# Patient Record
Sex: Female | Born: 1940 | Race: White | Hispanic: No | Smoking: Never smoker
Health system: Southern US, Community
[De-identification: ages and names within clinical notes are randomized; demographics above are authoritative.]

## PROBLEM LIST (undated history)

## (undated) DIAGNOSIS — L719 Rosacea, unspecified: Secondary | ICD-10-CM

## (undated) DIAGNOSIS — G8929 Other chronic pain: Secondary | ICD-10-CM

## (undated) DIAGNOSIS — M25569 Pain in unspecified knee: Secondary | ICD-10-CM

## (undated) DIAGNOSIS — N390 Urinary tract infection, site not specified: Secondary | ICD-10-CM

## (undated) DIAGNOSIS — D869 Sarcoidosis, unspecified: Secondary | ICD-10-CM

---

## 2013-07-01 ENCOUNTER — Emergency Department (HOSPITAL_BASED_OUTPATIENT_CLINIC_OR_DEPARTMENT_OTHER): Payer: Medicare Other

## 2013-07-01 ENCOUNTER — Encounter (HOSPITAL_BASED_OUTPATIENT_CLINIC_OR_DEPARTMENT_OTHER): Payer: Self-pay | Admitting: Emergency Medicine

## 2013-07-01 ENCOUNTER — Emergency Department (HOSPITAL_BASED_OUTPATIENT_CLINIC_OR_DEPARTMENT_OTHER)
Admission: EM | Admit: 2013-07-01 | Discharge: 2013-07-01 | Disposition: A | Discharge status: Home / Self Care | Payer: Medicare Other | Attending: Emergency Medicine | Admitting: Emergency Medicine

## 2013-07-01 DIAGNOSIS — IMO0002 Reserved for concepts with insufficient information to code with codable children: Secondary | ICD-10-CM | POA: Insufficient documentation

## 2013-07-01 DIAGNOSIS — Z87440 Personal history of urinary (tract) infections: Secondary | ICD-10-CM | POA: Insufficient documentation

## 2013-07-01 DIAGNOSIS — Y9289 Other specified places as the place of occurrence of the external cause: Secondary | ICD-10-CM | POA: Insufficient documentation

## 2013-07-01 DIAGNOSIS — S42209A Unspecified fracture of upper end of unspecified humerus, initial encounter for closed fracture: Secondary | ICD-10-CM | POA: Insufficient documentation

## 2013-07-01 DIAGNOSIS — Z872 Personal history of diseases of the skin and subcutaneous tissue: Secondary | ICD-10-CM | POA: Insufficient documentation

## 2013-07-01 DIAGNOSIS — R296 Repeated falls: Secondary | ICD-10-CM | POA: Insufficient documentation

## 2013-07-01 DIAGNOSIS — S2239XA Fracture of one rib, unspecified side, initial encounter for closed fracture: Secondary | ICD-10-CM | POA: Insufficient documentation

## 2013-07-01 DIAGNOSIS — G8929 Other chronic pain: Secondary | ICD-10-CM | POA: Insufficient documentation

## 2013-07-01 DIAGNOSIS — Y939 Activity, unspecified: Secondary | ICD-10-CM | POA: Insufficient documentation

## 2013-07-01 DIAGNOSIS — Z8619 Personal history of other infectious and parasitic diseases: Secondary | ICD-10-CM | POA: Insufficient documentation

## 2013-07-01 HISTORY — DX: Sarcoidosis, unspecified: D86.9

## 2013-07-01 HISTORY — DX: Urinary tract infection, site not specified: N39.0

## 2013-07-01 HISTORY — DX: Other chronic pain: G89.29

## 2013-07-01 HISTORY — DX: Pain in unspecified knee: M25.569

## 2013-07-01 HISTORY — DX: Rosacea, unspecified: L71.9

## 2013-07-01 MED ORDER — CYCLOBENZAPRINE HCL 5 MG PO TABS: 5.0000 mg | ORAL_TABLET | Freq: Two times a day (BID) | ORAL | Status: AC | PRN

## 2013-07-01 MED ORDER — HYDROCODONE-ACETAMINOPHEN 5-325 MG PO TABS: 1.0000 | ORAL_TABLET | ORAL | Status: AC | PRN

## 2013-07-01 NOTE — ED Notes (Signed)
Left shoulder and back pain.  States she was leaning over into a fountain, lost balance and fell into the water.

## 2013-07-01 NOTE — ED Provider Notes (Signed)
Medical screening examination/treatment/procedure(s) were conducted as a shared visit with non-physician practitioner(s) and myself.  I personally evaluated the patient during the encounter.   EKG Interpretation None      74F here s/p fall. Sustained L humeral head fx and a rib fx. NVI distally, decreased ROM of shoulder. Placed in sling, given Ortho f/u.  Dagmar HaitWilliam Edd Reppert, MD 07/01/13 [phone]

## 2013-07-01 NOTE — Discharge Instructions (Signed)
Humerus Fracture, Treated with Immobilization The humerus is the large bone in the upper arm. A broken (fractured) humerus is often treated by wearing a cast, splint, or sling (immobilization). This holds the broken pieces in place so they can heal.  HOME CARE  Put ice on the injured area.  Put ice in a plastic bag.  Place a towel between your skin and the bag.  Leave the ice on for 15-20 minutes, 03-04 times a day.  If you are given a cast:  Do not scratch the skin under the cast.  Check the skin around the cast every day. You may put lotion on any red or sore areas.  Keep the cast dry and clean.  If you are given a splint:  Wear the splint as told.  Keep the splint clean and dry.  Loosen the elastic around the splint if your fingers become numb, cold, tingle, or turn blue.  If you are given a sling:  Wear the sling as told.  Do not put pressure on any part of the cast or splint until it is fully hardened.  The cast or splint must be protected with a plastic bag during bathing. Do not lower the cast or splint into water.  Only take medicine as told by your doctor.  Do exercises as told by your doctor.  Follow up as told by your doctor. GET HELP RIGHT AWAY IF:   Your skin or fingernails turn blue or gray.  Your arm feels cold or numb.  You have very bad pain in the injured arm.  You are having problems with the medicines you were given. MAKE SURE YOU:   Understand these instructions.  Will watch your condition.  Will get help right away if you are not doing well or get worse. Document Released: 09/11/2007 Document Revised: 06/17/2011 Document Reviewed: 05/09/2010 [facility] Patient Information 2014 [place], [place].  Rib Fracture A rib fracture is a break or crack in one of the bones of the ribs. The ribs are like a cage that goes around your upper chest. A broken or cracked rib is often painful, but most do not cause other problems. Most rib fractures heal  on their own in 1 3 months. HOME CARE  Avoid activities that cause pain to the injured area. Protect your injured area.  Slowly increase activity as told by your doctor.  Take medicine as told by your doctor.  Put ice on the injured area for the first 1 2 days after you have been treated or as told by your doctor.  Put ice in a plastic bag.  Place a towel between your skin and the bag.  Leave the ice on for 15 20 minutes at a time, every 2 hours while you are awake.  Do deep breathing as told by your doctor. You may be told to:  Take deep breaths many times a day.  Cough many times a day while hugging a pillow.  Use a device (incentive spirometer) to perform deep breathing many times a day.  Drink enough fluids to keep your pee (urine) clear or pale yellow.   Do not wear a rib belt or binder. These do not allow you to breathe deeply. GET HELP RIGHT AWAY IF:   You have a fever.  You have trouble breathing.   You cannot stop coughing.  You cough up thick or bloody spit (mucus).   You feel sick to your stomach (nauseous), throw up (vomit), or have belly (abdominal) pain.  Your pain gets worse and medicine does not help.  MAKE SURE YOU:   Understand these instructions.  Will watch your condition.  Will get help right away if you are not doing well or get worse. Document Released: 01/02/2008 Document Revised: 07/20/2012 Document Reviewed: 05/27/2012 [facility] Patient Information 2014 [place], [place].

## 2013-07-01 NOTE — ED Notes (Signed)
Patient getting dressed.

## 2013-07-01 NOTE — ED Provider Notes (Signed)
CSN: [id number]     Arrival date & time 07/01/13  1152 History   First MD Initiated Contact with Patient 07/01/13 1204     Chief Complaint  Patient presents with  . Fall  . Shoulder Pain  . Back Pain     (Consider location/radiation/quality/duration/timing/severity/associated sxs/prior Treatment) HPI Comments: Pt states that she was leaning over the fountain and lost her balance and fell in the water. Pt states that it was no loc or dizziness.pt is complaining of pain in the left shoulder. Pt states that she cant raise her arm. Denies numbness or weakness. Pt states that she didn't hit her head.  The history is provided by the patient. No language interpreter was used.    Past Medical History  Diagnosis Date  . UTI (urinary tract infection)   . Chronic knee pain   . Rosacea   . Sarcoidosis    History reviewed. No pertinent past surgical history. No family history on file. History  Substance Use Topics  . Smoking status: Never Smoker   . Smokeless tobacco: Not on file  . Alcohol Use: Yes     Comment: occasional   OB History   Grav Para Term Preterm Abortions TAB SAB Ect Mult Living                 Review of Systems  Constitutional: Negative.   Respiratory: Negative.   Cardiovascular: Negative.       Allergies  Review of patient's allergies indicates no known allergies.  Home Medications   Current Outpatient Rx  Name  Route  Sig  Dispense  Refill  . Ciprofloxacin (CIPRO PO)   Oral   Take by mouth.         . Ibuprofen 200 MG CAPS   Oral   Take by mouth as needed.          BP 161/92  Pulse 88  Temp(Src) 98 F (36.7 C) (Oral)  Resp 18  Ht 5\' 3"  (1.6 m)  Wt 275 lb (124.739 kg)  BMI 48.73 kg/m2  SpO2 96% Physical Exam  Constitutional: She is oriented to person, place, and time. She appears well-developed and well-nourished.  HENT:  Head: Normocephalic and atraumatic.  Eyes: Conjunctivae and EOM are normal. Pupils are equal, round, and reactive  to light.  Neck: Normal range of motion. Neck supple.  Pulmonary/Chest: Effort normal and breath sounds normal.  Abdominal: Soft. Bowel sounds are normal.  Musculoskeletal:       Cervical back: Normal.       Thoracic back: Normal.       Lumbar back: Normal.  Abrasion the left posterior ribs. Tender in the lateral aspect of the left shoulder without gross deformity  Neurological: She is alert and oriented to person, place, and time. Coordination normal.  Skin: Skin is warm and dry.  Psychiatric: She has a normal mood and affect.    ED Course  Procedures (including critical care time) Labs Review Labs Reviewed - No data to display Imaging Review Dg Ribs Unilateral W/chest Left  07/01/2013   CLINICAL DATA:  Recent traumatic injury with left-sided chest pain  EXAM: LEFT RIBS AND CHEST - 3+ VIEW  COMPARISON:  None.  FINDINGS: Mild irregularity of the left sixth rib is noted laterally consistent with a mildly displaced fracture. No underlying pneumothorax is seen. The comminuted fracture of the proximal left humerus is again identified.  IMPRESSION: Left sixth rib fracture.  Left proximal humeral fracture   Electronically Signed  By: Alcide CleverMark  Lukens M.D.   On: 07/01/2013 12:42   Dg Shoulder Left  07/01/2013   CLINICAL DATA:  Left shoulder pain after fall.  EXAM: LEFT SHOULDER - 2+ VIEW  COMPARISON:  None.  FINDINGS: Severely displaced and probably comminuted fracture is seen involving the left proximal humerus. The humeral head appears to be posterior to the glenoid labrum. Visualized ribs appear normal.  IMPRESSION: Severely displaced and probably comminuted fracture involving the left proximal humerus.   Electronically Signed   By: Roque LiasJames  Green M.D.   On: 07/01/2013 12:39     EKG Interpretation None      MDM   Final diagnoses:  Rib fracture  Proximal humerus fracture    Pt is neurologically intact. Pt placed in a sling.pt is not from the area so will see ortho when she gets home. No  sign of injury to head and pt is not on thinners    Teressa LowerVrinda Hanni [place], NP 07/01/13 1318

## 2015-03-01 IMAGING — CR DG SHOULDER 2+V*L*
2 series · 2 of 2 positions shown · non-contrast
Comparison: None.

CLINICAL DATA: Left shoulder pain after fall.

EXAM:
LEFT SHOULDER - 2+ VIEW

[w shoulder ap internal left]
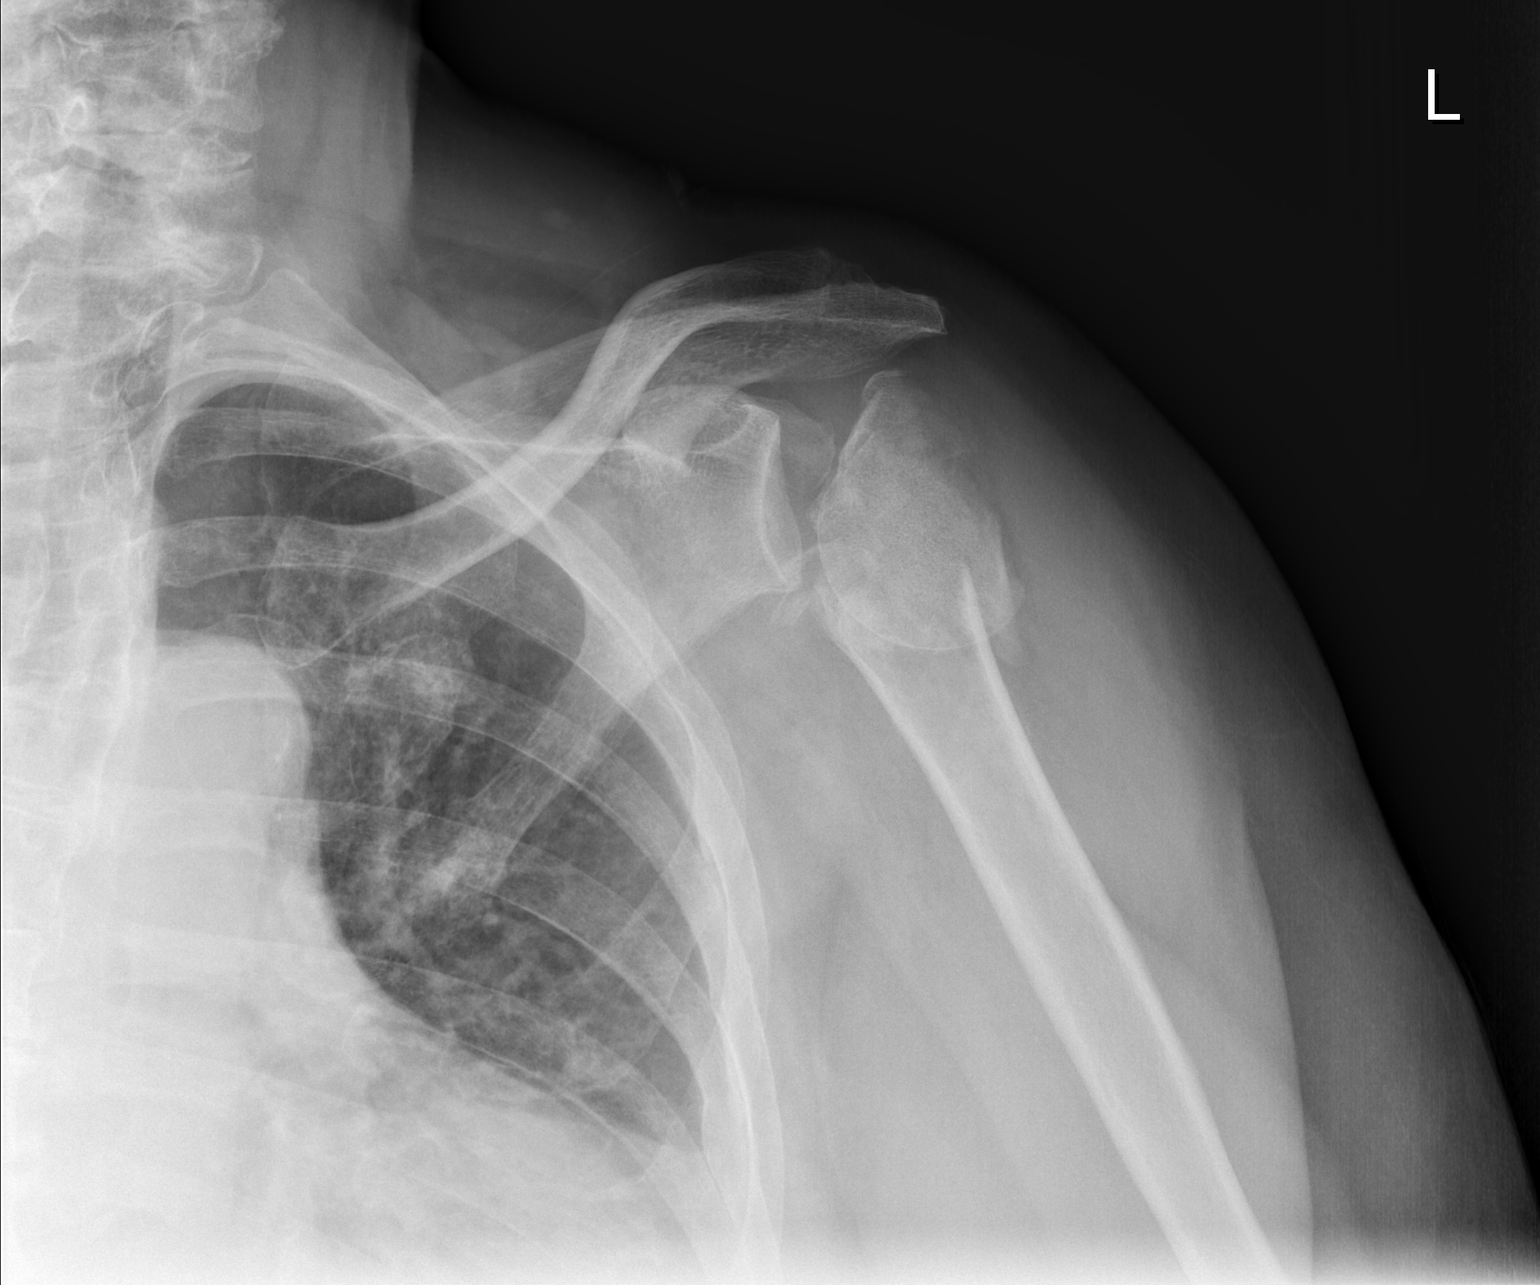

[w shoulder y view left]
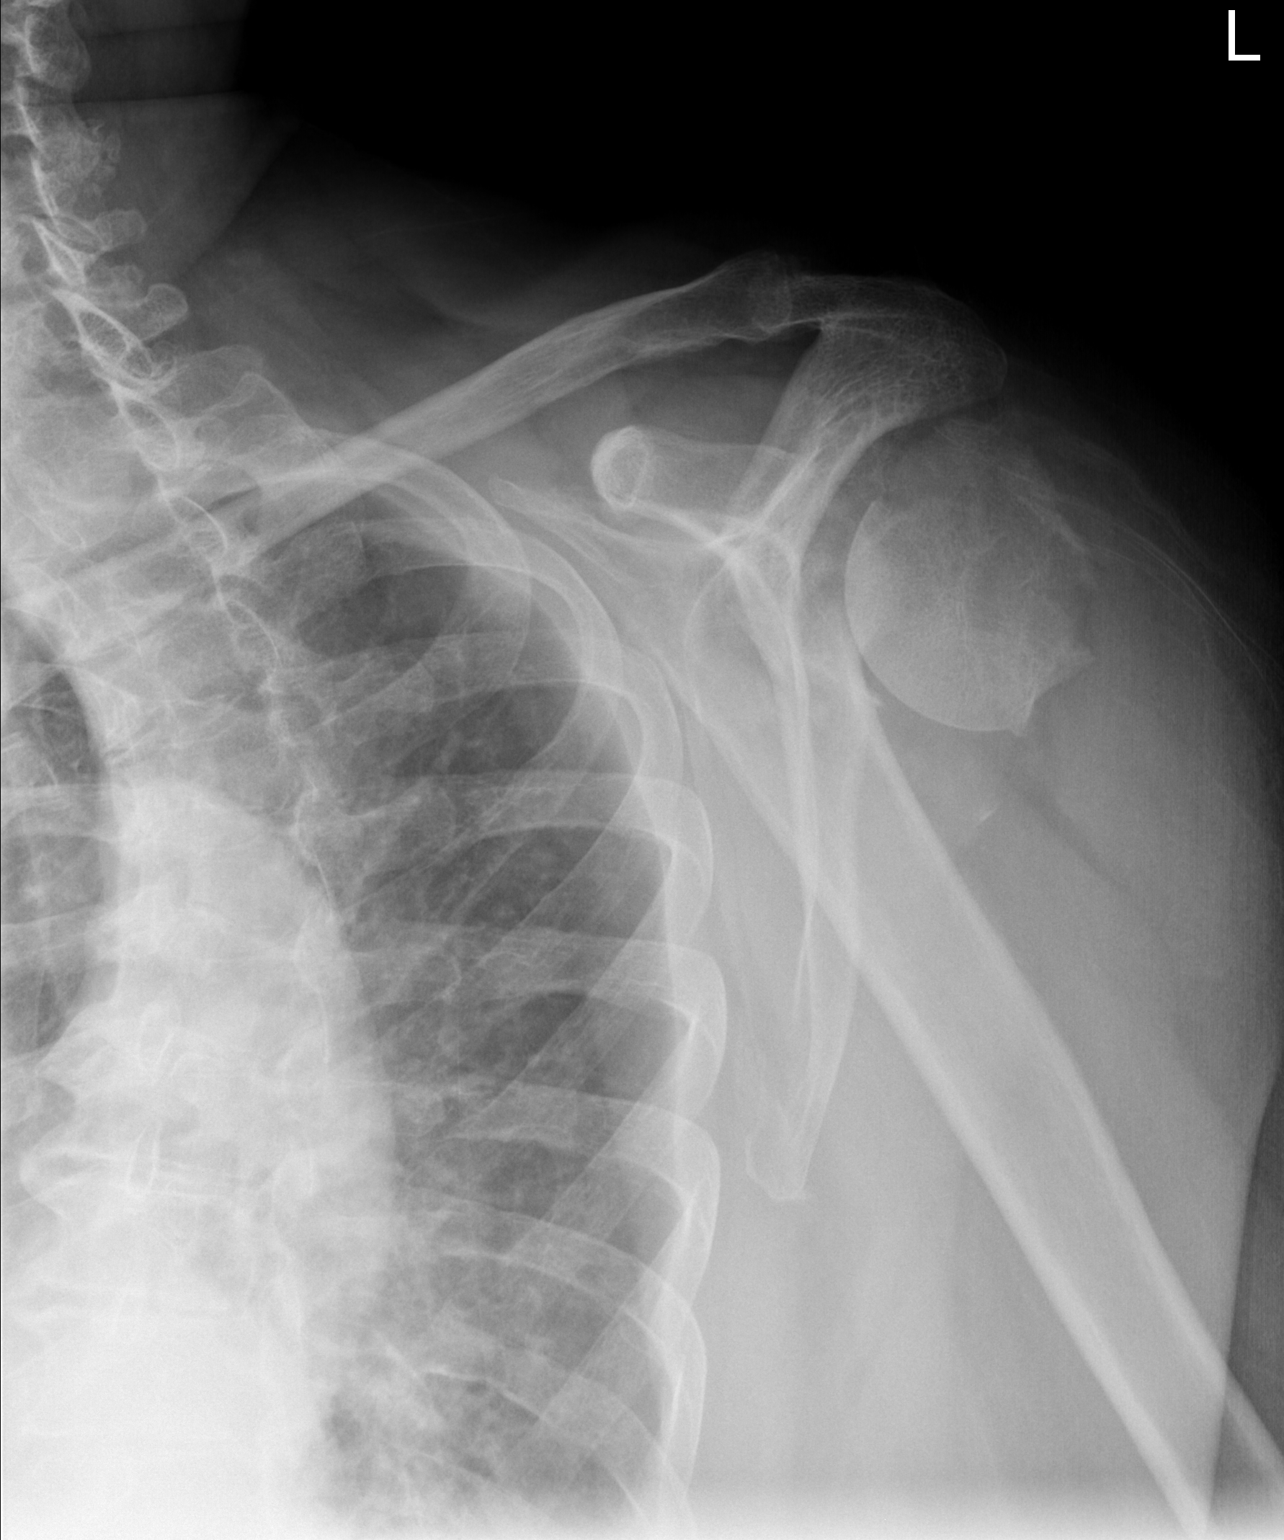

[2 of 2 positions shown; findings below may reference images not displayed]

FINDINGS: Severely displaced and probably comminuted fracture is seen
involving the left proximal humerus. The humeral head appears to be
posterior to the glenoid labrum. Visualized ribs appear normal.
IMPRESSION: Severely displaced and probably comminuted fracture involving the
left proximal humerus.

## 2015-03-01 IMAGING — CR DG RIBS W/ CHEST 3+V*L*
3 series · 3 of 3 positions shown · non-contrast
Comparison: None.

CLINICAL DATA: Recent traumatic injury with left-sided chest pain

EXAM:
LEFT RIBS AND CHEST - 3+ VIEW

[w chest pa]
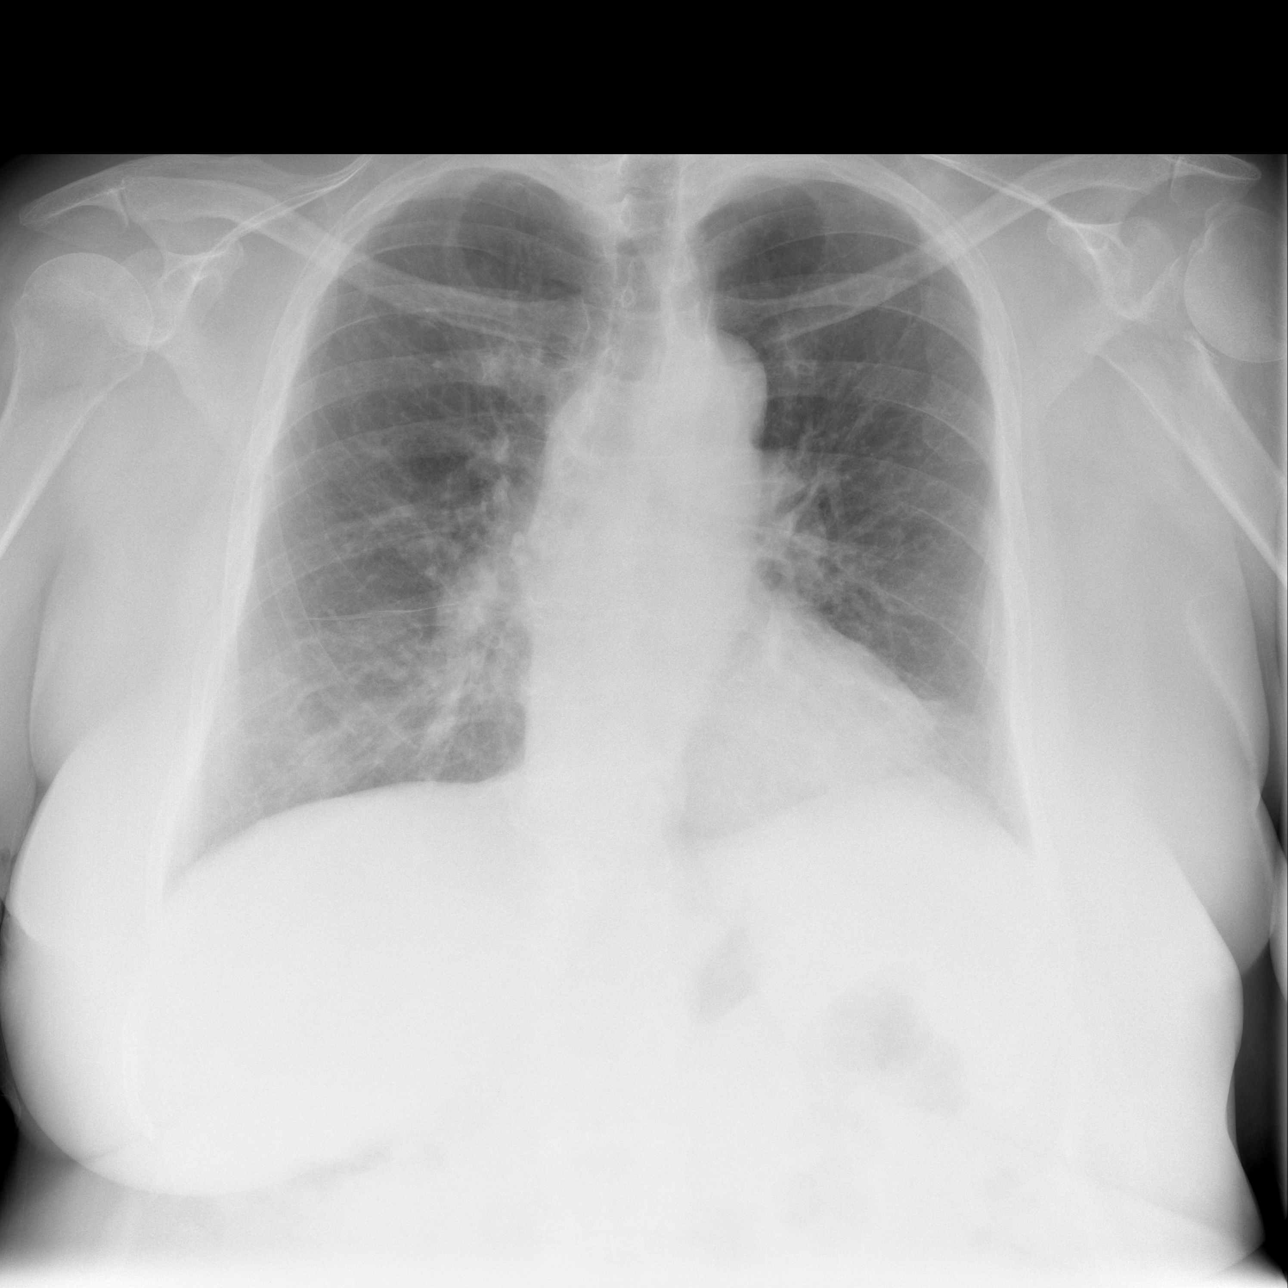

[w ribs ap/pa upper left]
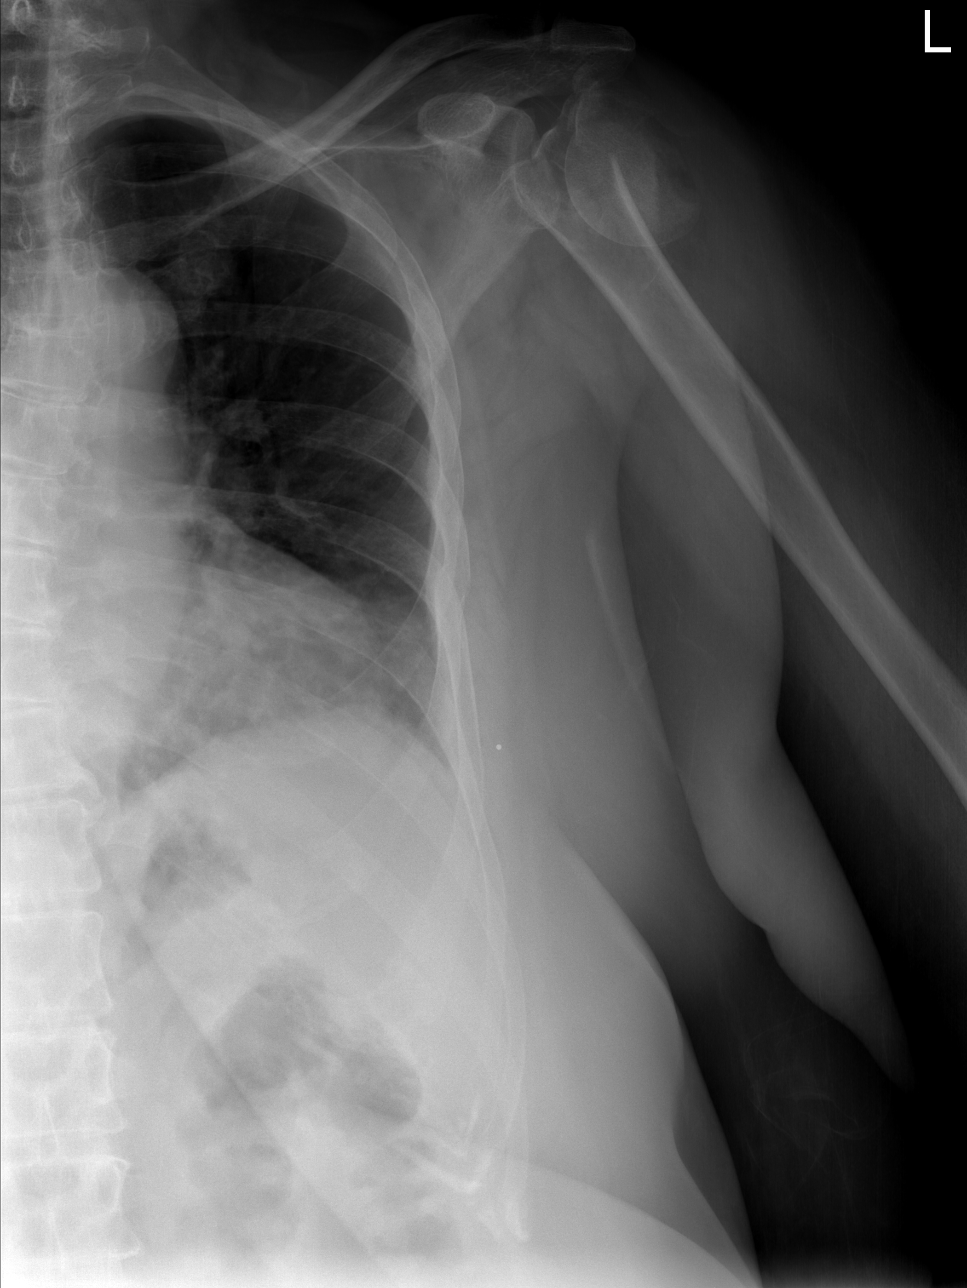

[w ribs ap/pa lower left]
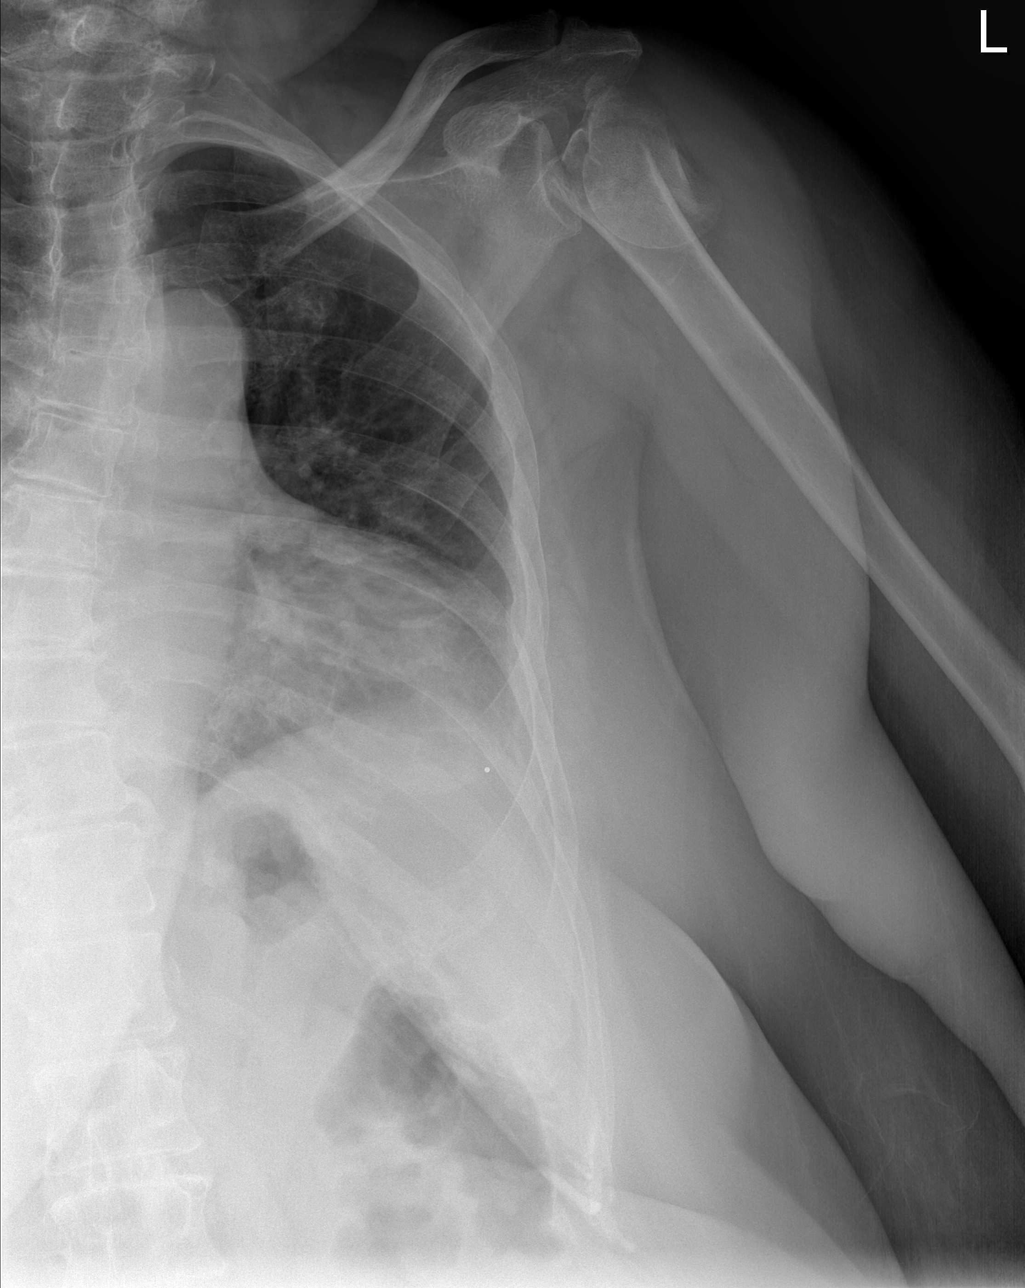

[3 of 3 positions shown; findings below may reference images not displayed]

FINDINGS: Mild irregularity of the left sixth rib is noted laterally
consistent with a mildly displaced fracture. No underlying
pneumothorax is seen. The comminuted fracture of the proximal left
humerus is again identified.
IMPRESSION: Left sixth rib fracture.

Left proximal humeral fracture

## 2023-04-09 DEATH — deceased
